# Patient Record
Sex: Male | Born: 1980 | Race: White | Hispanic: Yes | Marital: Married | Smoking: Current some day smoker
Health system: Southern US, Community
[De-identification: ages and names within clinical notes are randomized; demographics above are authoritative.]

---

## 2013-06-02 ENCOUNTER — Emergency Department (HOSPITAL_COMMUNITY)
Admission: EM | Admit: 2013-06-02 | Discharge: 2013-06-02 | Disposition: A | Discharge status: Home / Self Care | Payer: Self-pay | Attending: Emergency Medicine | Admitting: Emergency Medicine

## 2013-06-02 ENCOUNTER — Emergency Department (HOSPITAL_COMMUNITY): Payer: Self-pay

## 2013-06-02 ENCOUNTER — Encounter (HOSPITAL_COMMUNITY): Payer: Self-pay | Admitting: Emergency Medicine

## 2013-06-02 DIAGNOSIS — G8921 Chronic pain due to trauma: Secondary | ICD-10-CM | POA: Insufficient documentation

## 2013-06-02 DIAGNOSIS — M62830 Muscle spasm of back: Secondary | ICD-10-CM

## 2013-06-02 DIAGNOSIS — M545 Low back pain, unspecified: Secondary | ICD-10-CM

## 2013-06-02 DIAGNOSIS — M5380 Other specified dorsopathies, site unspecified: Secondary | ICD-10-CM | POA: Insufficient documentation

## 2013-06-02 DIAGNOSIS — Z87828 Personal history of other (healed) physical injury and trauma: Secondary | ICD-10-CM | POA: Insufficient documentation

## 2013-06-02 DIAGNOSIS — F17200 Nicotine dependence, unspecified, uncomplicated: Secondary | ICD-10-CM | POA: Insufficient documentation

## 2013-06-02 MED ORDER — CYCLOBENZAPRINE HCL 10 MG PO TABS: 10.0000 mg | ORAL_TABLET | Freq: Two times a day (BID) | ORAL | Status: AC | PRN

## 2013-06-02 MED ORDER — HYDROCODONE-ACETAMINOPHEN 5-325 MG PO TABS: ORAL_TABLET | ORAL | Status: AC

## 2013-06-02 NOTE — Progress Notes (Signed)
[facility] provided pt with a list of self-pay pcp and a [facility] [employer] application to help patient establish primary care.  °

## 2013-06-02 NOTE — ED Notes (Signed)
Pt c/o a fall 15 yrs ago; continued pain; lower back; worse x 3 wks

## 2013-06-02 NOTE — ED Provider Notes (Signed)
CSN: [id number]     Arrival date & time 06/02/13  1031 History   First MD Initiated Contact with Patient 06/02/13 1132 This chart was scribed for non-physician practitioner Junius FinnerErin O'Malley, PA-C working with Shanna CiscoMegan E Docherty, MD by Valera CastleSteven Perry, ED scribe. This patient was seen in room WTR8/WTR8 and the patient's care was started at 12:19 PM.     Chief Complaint  Patient presents with  . Back Pain   (Consider location/radiation/quality/duration/timing/severity/associated sxs/prior Treatment) The history is provided by the patient. No language interpreter was used.   HPI Comments: Alan Irwin is a 33 y.o. male who presents to the Emergency Department complaining of intermittent, 9/10, mid back pain, onset 2 weeks ago, gradually worsening onset 3 days ago. He denies recent injuries, but reports h/o back injury years ago. He states he fell while carrying a heavy object, landing on his buttocks and the impact with the object injured his back. He states he never received treatment for his back at that time. He reports his current pain has been worse over the last 2 weeks than it has been since his injury. He states that movement, standing up exacerbates his pain, stating he feels a pulling feeling in his back. He has taken Ibuprofen for his pain without relief. He denies fever, nausea, vomiting, LE pain and numbness, neck pain, dysuria, and any other associated symptoms.   PCP - No primary provider on file.  History reviewed. No pertinent past medical history. History reviewed. No pertinent past surgical history. No family history on file. History  Substance Use Topics  . Smoking status: Current Some Day Smoker    Types: Cigarettes  . Smokeless tobacco: Not on file  . Alcohol Use: No    Review of Systems  Constitutional: Negative for fever.  Gastrointestinal: Negative for nausea and vomiting.  Genitourinary: Negative for dysuria.  Musculoskeletal: Positive for back pain. Negative for neck  pain.  Skin: Negative for wound.  Neurological: Negative for weakness and numbness.   Allergies  Review of patient's allergies indicates no known allergies.  Home Medications   Current Outpatient Rx  Name  Route  Sig  Dispense  Refill  . cyclobenzaprine (FLEXERIL) 10 MG tablet   Oral   Take 1 tablet (10 mg total) by mouth 2 (two) times daily as needed for muscle spasms.   20 tablet   0   . HYDROcodone-acetaminophen (NORCO/VICODIN) 5-325 MG per tablet      Take 1-2 pills every 4-6 hours as needed for pain.   15 tablet   0   . ibuprofen (ADVIL,MOTRIN) 200 MG tablet   Oral   Take 800-1,000 mg by mouth every 6 (six) hours as needed for moderate pain.          BP 115/67  Pulse 68  Temp(Src) 98.1 F (36.7 C)  Resp 20  SpO2 99%  Physical Exam  Nursing note and vitals reviewed. Constitutional: He appears well-developed and well-nourished.  HENT:  Head: Normocephalic and atraumatic.  Eyes: Conjunctivae are normal. No scleral icterus.  Neck: Normal range of motion.  Cardiovascular: Normal rate, regular rhythm and normal heart sounds.   No murmur heard. Pulmonary/Chest: Effort normal. No respiratory distress.  Musculoskeletal: Normal range of motion. He exhibits tenderness.  Tenderness along middle l-spine. No step offs or crepitus. No tenderness in paraspinal muscles. Antalgic gait.   Neurological: He is alert.  Skin: Skin is warm and dry.  Normal, no erythema, lesions, or ecchymosis.   Psychiatric: He has a  normal mood and affect. His behavior is normal.   ED Course  Procedures (including critical care time)  DIAGNOSTIC STUDIES: Oxygen Saturation is 99% on room air, normal by my interpretation.    COORDINATION OF CARE: 12:23 PM-Discussed treatment plan which includes a DG lumbar spine with pt at bedside and pt agreed to plan.   1:33 PM - Referred pt to Neurosurgeon Dr. Wynetta Emery. Advised pt to follow up if symptoms persist.   Labs Review Labs Reviewed - No data to  display Imaging Review Dg Lumbar Spine Complete  06/02/2013   CLINICAL DATA:  Low back pain.  EXAM: LUMBAR SPINE - COMPLETE 4+ VIEW  COMPARISON:  None.  FINDINGS: There is no evidence of lumbar spine fracture. Alignment is normal. Intervertebral disc spaces are maintained. No bony lesions are identified.  IMPRESSION: Normal lumbar spine.   Electronically Signed   By: Irish Lack M.D.   On: 06/02/2013 13:21     EKG Interpretation None     Medications - No data to display MDM   Final diagnoses:  Low back pain  Muscle spasm of back    Pt with hx of lower back pain c/o exacerbation x2 weeks w/o known fall or trauma to back. Denies red flag symptoms. Reports remote hx of back injury but no f/u.  Plain films ordered.  Plain films-normal lumbar spine.  Not concerned for emergent process taking place at this time. Will tx symptomatically for pain. Rx: Norco and flexeril.  Return precautions provided. Pt verbalized understanding and agreement with tx plan.  I personally performed the services described in this documentation, which was scribed in my presence. The recorded information has been reviewed and is accurate.    Junius Finner, PA-C 06/02/13 1455

## 2013-06-02 NOTE — ED Provider Notes (Signed)
Medical screening examination/treatment/procedure(s) were performed by non-physician practitioner and as supervising physician I was immediately available for consultation/collaboration.  Shianne Zeiser E Gaynor Genco, MD 06/02/13 1631 

## 2015-08-05 IMAGING — CR DG LUMBAR SPINE COMPLETE 4+V
5 series · 5 of 5 positions shown · non-contrast
Comparison: None.

CLINICAL DATA: Low back pain.

EXAM:
LUMBAR SPINE - COMPLETE 4+ VIEW

[t lumbar spine ap]
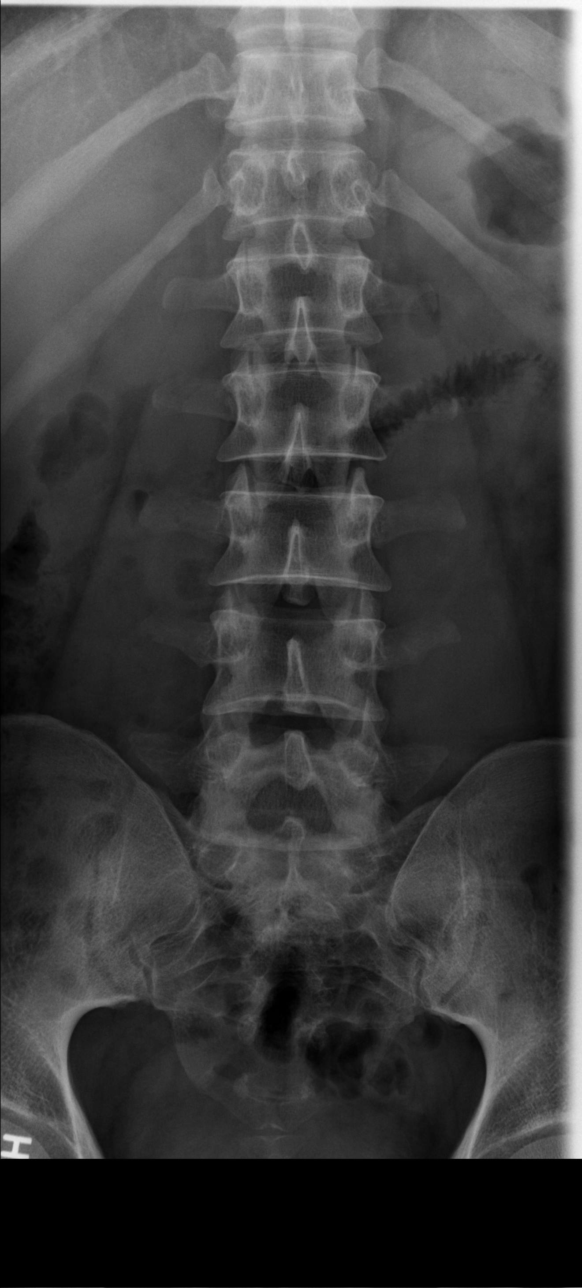

[t lumbar spine obl (1 of 2)]
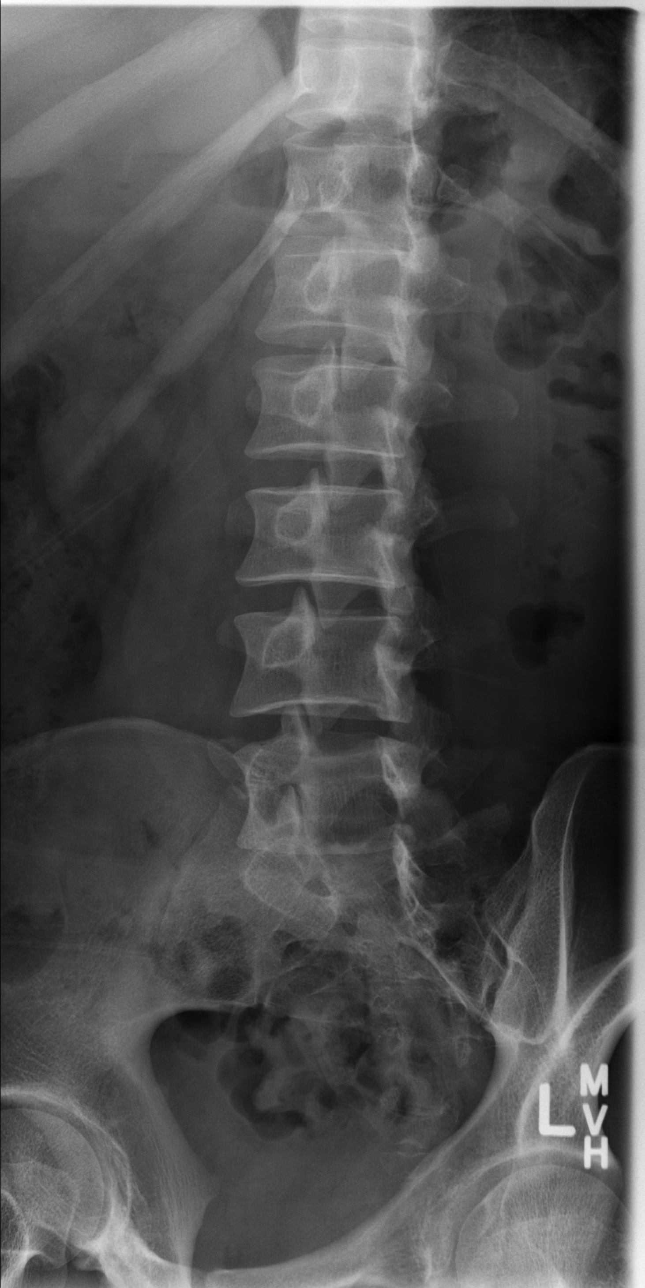

[t lumbar spine obl (2 of 2)]
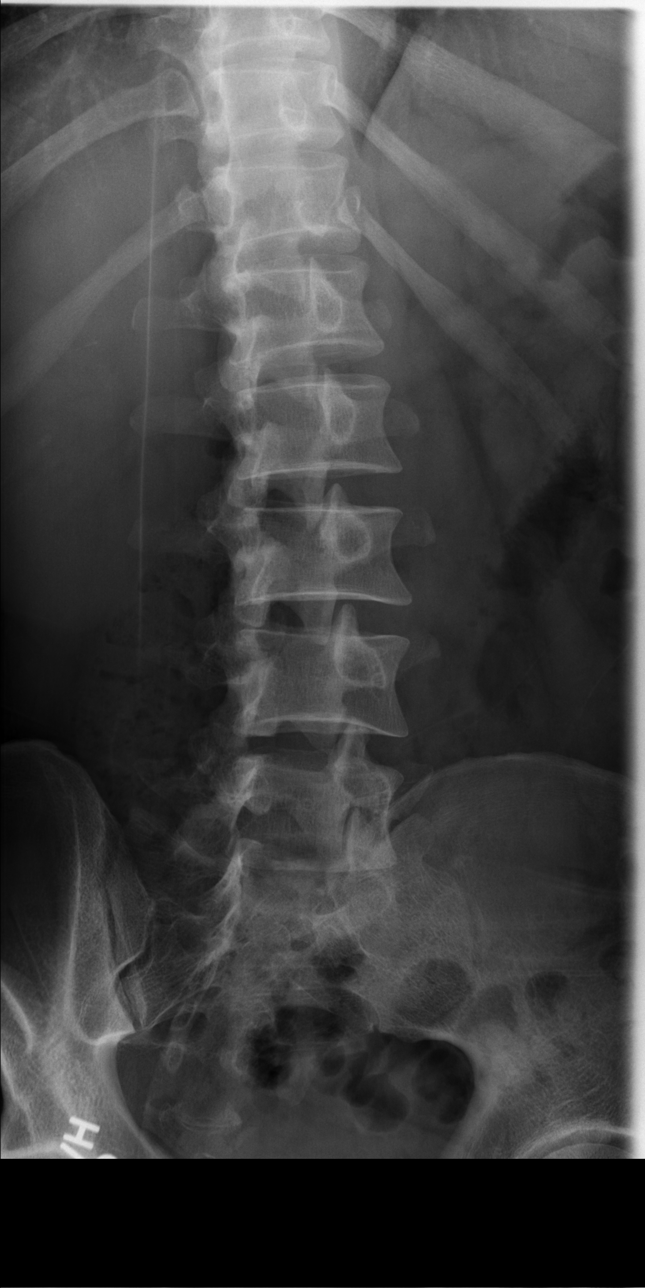

[t lumbar spine lat]
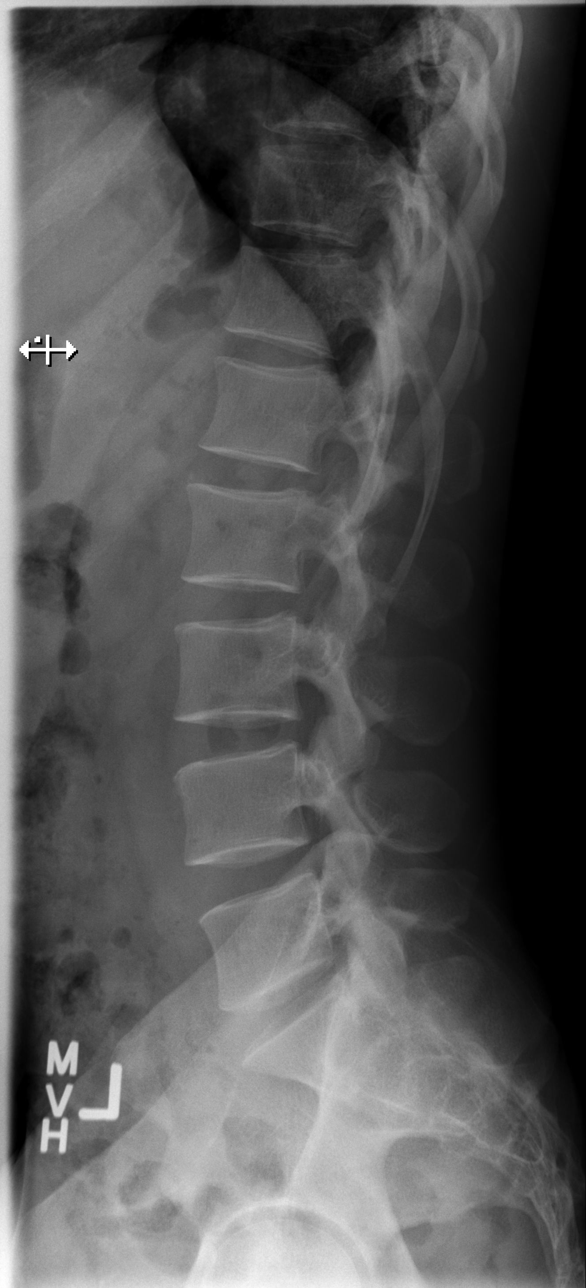

[t lumbar l-5 s-1 spot]
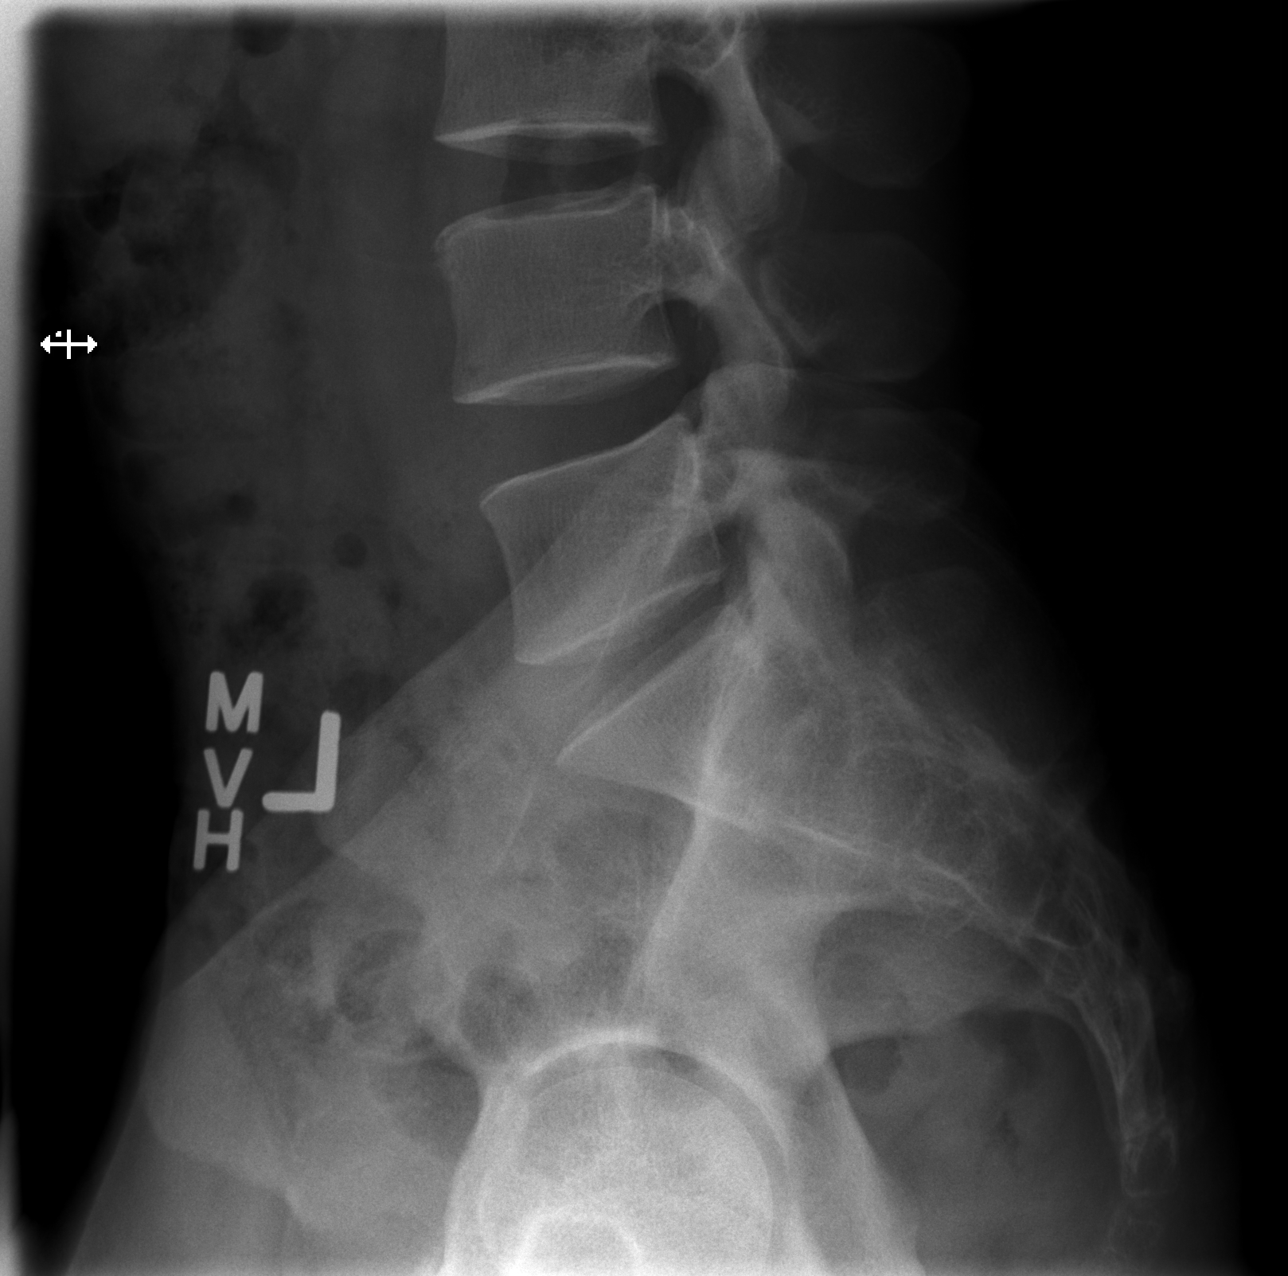

[5 of 5 positions shown; findings below may reference images not displayed]

FINDINGS: There is no evidence of lumbar spine fracture. Alignment is normal.
Intervertebral disc spaces are maintained. No bony lesions are
identified.
IMPRESSION: Normal lumbar spine.
